# Patient Record
Sex: Male | Born: 2005
Health system: Southern US, Community
[De-identification: ages and names within clinical notes are randomized; demographics above are authoritative.]

## PROBLEM LIST (undated history)

## (undated) DIAGNOSIS — R790 Abnormal level of blood mineral: Secondary | ICD-10-CM

## (undated) DIAGNOSIS — M9252 Juvenile osteochondrosis of tibia and fibula, left leg: Secondary | ICD-10-CM

## (undated) DIAGNOSIS — J45909 Unspecified asthma, uncomplicated: Secondary | ICD-10-CM

## (undated) DIAGNOSIS — L309 Dermatitis, unspecified: Secondary | ICD-10-CM

## (undated) DIAGNOSIS — J302 Other seasonal allergic rhinitis: Secondary | ICD-10-CM

## (undated) DIAGNOSIS — M9251 Juvenile osteochondrosis of tibia and fibula, right leg: Secondary | ICD-10-CM

## (undated) DIAGNOSIS — M92523 Juvenile osteochondrosis of tibia tubercle, bilateral: Secondary | ICD-10-CM

## (undated) HISTORY — DX: Juvenile osteochondrosis of tibia and fibula, left leg: M92.52

## (undated) HISTORY — PX: NO PAST SURGERIES: SHX2092

## (undated) HISTORY — DX: Juvenile osteochondrosis of tibia tubercle, bilateral: M92.523

## (undated) HISTORY — DX: Abnormal level of blood mineral: R79.0

## (undated) HISTORY — DX: Dermatitis, unspecified: L30.9

## (undated) HISTORY — DX: Juvenile osteochondrosis of tibia and fibula, right leg: M92.51

---
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
## 2024-02-18 ENCOUNTER — Emergency Department (HOSPITAL_BASED_OUTPATIENT_CLINIC_OR_DEPARTMENT_OTHER)
Admission: EM | Admit: 2024-02-18 | Discharge: 2024-02-18 | Disposition: A | Discharge status: Home / Self Care | Payer: Self-pay | Attending: Emergency Medicine | Admitting: Emergency Medicine

## 2024-02-18 ENCOUNTER — Encounter (HOSPITAL_BASED_OUTPATIENT_CLINIC_OR_DEPARTMENT_OTHER): Payer: Self-pay | Admitting: Emergency Medicine

## 2024-02-18 ENCOUNTER — Other Ambulatory Visit: Payer: Self-pay

## 2024-02-18 DIAGNOSIS — J45901 Unspecified asthma with (acute) exacerbation: Secondary | ICD-10-CM

## 2024-02-18 DIAGNOSIS — J069 Acute upper respiratory infection, unspecified: Secondary | ICD-10-CM

## 2024-02-18 LAB — RESP PANEL BY RT-PCR (RSV, FLU A&B, COVID)  RVPGX2
Influenza A by PCR: NEGATIVE
Influenza B by PCR: NEGATIVE
Resp Syncytial Virus by PCR: NEGATIVE
SARS Coronavirus 2 by RT PCR: NEGATIVE

## 2024-02-18 LAB — GROUP A STREP BY PCR: Group A Strep by PCR: NOT DETECTED

## 2024-02-18 MED ORDER — ALBUTEROL SULFATE HFA 108 (90 BASE) MCG/ACT IN AERS: 1.0000 | INHALATION_SPRAY | Freq: Four times a day (QID) | RESPIRATORY_TRACT | 1 refills | Status: AC | PRN

## 2024-02-18 MED ORDER — ALBUTEROL SULFATE HFA 108 (90 BASE) MCG/ACT IN AERS
1.0000 | INHALATION_SPRAY | Freq: Once | RESPIRATORY_TRACT | Status: AC
  Administered 2024-02-18: 2 via RESPIRATORY_TRACT
  Filled 2024-02-18: qty 6.7

## 2024-02-18 MED ORDER — DEXAMETHASONE 10 MG/ML FOR PEDIATRIC ORAL USE
10.0000 mg | Freq: Once | INTRAMUSCULAR | Status: AC
  Administered 2024-02-18: 10 mg via ORAL

## 2024-02-18 MED ORDER — PREDNISONE 10 MG (21) PO TBPK: ORAL_TABLET | Freq: Every day | ORAL | 0 refills | Status: AC

## 2024-02-18 NOTE — ED Provider Notes (Signed)
 [place] [facility] Provider Note   CSN: [id number] Arrival date & time: 02/18/24  2038     Patient presents with: URI   Aaron Carter is a 18 y.o. male.  Past history significant for asthma presents today for cough and bodyaches since this morning.  Patient denies fever, chills, nausea, vomiting, chest pain, or shortness of breath.    URI Presenting symptoms: cough        Prior to Admission medications   Medication Sig Start Date End Date Taking? Authorizing Provider  albuterol  (VENTOLIN  HFA) 108 (90 Base) MCG/ACT inhaler Inhale 1-2 puffs into the lungs every 6 (six) hours as needed for wheezing or shortness of breath. 02/18/24  Yes Skarlett Sedlacek N, PA-C  predniSONE  (STERAPRED UNI-PAK 21 TAB) 10 MG (21) TBPK tablet Take by mouth daily. Take 6 tabs by mouth daily  for 2 days, then 5 tabs for 2 days, then 4 tabs for 2 days, then 3 tabs for 2 days, 2 tabs for 2 days, then 1 tab by mouth daily for 2 days 02/18/24  Yes Francis Ileana SAILOR, PA-C  albuterol  (PROAIR  HFA) 108 (90 Base) MCG/ACT inhaler Inhale 2 puffs into the lungs every 4 (four) hours as needed for wheezing or shortness of breath. 09/16/20   Willo Mini, NP  azelastine  (ASTELIN ) 0.1 % nasal spray 1 spray as needed.     [provider]  budesonide -formoterol  (SYMBICORT ) 160-4.5 MCG/ACT inhaler Take 2 puffs twice a day during upper respiratory infections for 1-2 weeks. Otherwise take 2 puffs once a day. 09/16/20   Willo Mini, NP  cetirizine  (ZYRTEC ) 10 MG tablet Take 1 tablet (10 mg total) by mouth daily. 09/16/20   Willo Mini, NP  EPINEPHrine  (AUVI-Q ) 0.3 mg/0.3 mL IJ SOAJ injection Use as direct for severe allergic reaction 09/16/20   Willo Mini, NP  fluticasone  (FLOVENT  HFA) 110 MCG/ACT inhaler Inhale 2 puffs into the lungs 2 (two) times daily. 09/16/20   Willo Mini, NP  meloxicam  (MOBIC ) 15 MG tablet One tab PO qAM with a meal for 2 weeks, then daily prn pain. 09/15/20   Curtis Debby PARAS,  MD  montelukast  (SINGULAIR ) 10 MG tablet Take 1 tablet (10 mg total) by mouth at bedtime. 09/16/20   Willo Mini, NP  Olopatadine  HCl 0.2 % SOLN olopatadine  0.2 % eye drops    [provider]  triamcinolone  (NASACORT ) 55 MCG/ACT AERO nasal inhaler 1-2 sprays per nostril once a day as needed for stuffy nose 09/16/20   Willo Mini, NP    Allergies: Patient has no known allergies.    Review of Systems  Respiratory:  Positive for cough.     Updated Vital Signs BP (!) 156/95 (BP Location: Right Arm)   Pulse 84   Temp 98.1 F (36.7 C) (Oral)   Resp 18   Ht 6' 5.25 (1.962 m)   SpO2 100%   Physical Exam Vitals and nursing note reviewed.  Constitutional:      General: He is not in acute distress.    Appearance: He is well-developed. He is not toxic-appearing.  HENT:     Head: Normocephalic and atraumatic.     Right Ear: External ear normal.     Left Ear: External ear normal.     Nose: Congestion and rhinorrhea present.     Mouth/Throat:     Mouth: Mucous membranes are moist.     Pharynx: Oropharynx is clear. Uvula midline. Posterior oropharyngeal erythema present. No oropharyngeal exudate or  uvula swelling.     Tonsils: No tonsillar exudate or tonsillar abscesses.  Eyes:     Extraocular Movements: Extraocular movements intact.     Conjunctiva/sclera: Conjunctivae normal.  Cardiovascular:     Rate and Rhythm: Normal rate and regular rhythm.     Pulses: Normal pulses.     Heart sounds: Normal heart sounds. No murmur heard. Pulmonary:     Effort: Pulmonary effort is normal. No respiratory distress.     Breath sounds: Normal breath sounds.     Comments: Few wheezes noted in all lobes Abdominal:     Palpations: Abdomen is soft.     Tenderness: There is no abdominal tenderness.  Musculoskeletal:        General: No swelling.     Cervical back: Neck supple.  Skin:    General: Skin is warm and dry.     Capillary Refill: Capillary refill takes less than 2 seconds.   Neurological:     General: No focal deficit present.     Mental Status: He is alert and oriented to person, place, and time.  Psychiatric:        Mood and Affect: Mood normal.     (all labs ordered are listed, but only abnormal results are displayed) Labs Reviewed  GROUP A STREP BY PCR  RESP PANEL BY RT-PCR (RSV, FLU A&B, COVID)  [id number]    EKG: None  Radiology: No results found.   Procedures   Medications Ordered in the ED  dexamethasone  (DECADRON ) 1 MG/ML solution 10 mg (has no administration in time range)  albuterol  (VENTOLIN  HFA) 108 (90 Base) MCG/ACT inhaler 1-2 puff (has no administration in time range)                                    Medical Decision Making  This patient presents to the ED for concern of cough and bodyaches differential diagnosis includes COVID, flu, RSV, strep pharyngitis, viral URI    Additional history obtained   Additional history obtained from Electronic Medical Record External records from outside source obtained and reviewed including family medicine notes   Lab Tests:  I Ordered, and personally interpreted labs.  The pertinent results include: Strep PCR negative, respiratory panel   Medicines ordered and prescription drug management:  I ordered medication including albuterol  and Decadron     I have reviewed the patients home medicines and have made adjustments as needed   Problem List / ED Course:  Considered for admission or further workup however patient's vital signs, physical exam, and labs are reassuring.  Patient's symptoms likely due to acute asthma exacerbation.  Patient given outpatient treatment with albuterol  inhaler and prednisone  taper.  Patient also given symptomatic management with over-the-counter medications.  Patient given return precautions.  I feel patient is safe for discharge at this time.     Final diagnoses:  Upper respiratory tract infection, unspecified type  Mild asthma with acute  exacerbation, unspecified whether persistent    ED Discharge Orders          Ordered    albuterol  (VENTOLIN  HFA) 108 (90 Base) MCG/ACT inhaler  Every 6 hours PRN        02/18/24 2213    predniSONE  (STERAPRED UNI-PAK 21 TAB) 10 MG (21) TBPK tablet  Daily        02/18/24 2213               Francis,  Ileana SAILOR, PA-C 02/18/24 2215    Tegeler, Lonni PARAS, MD 02/18/24 2330

## 2024-02-18 NOTE — Discharge Instructions (Signed)
 Today you were seen for an asthma exacerbation and upper respiratory infection.  Please pick up your albuterol  inhaler and prednisone  taper and use as directed.  You may alternate taking Tylenol and Motrin as needed for fever and pain, Flonase  as needed for nasal congestion, and plain Mucinex as needed for chest congestion. Thank you for letting us  treat you today. After reviewing your labs and imaging, I feel you are safe to go home. Please follow up with your PCP in the next several days and provide them with your records from this visit. Return to the Emergency Room if pain becomes severe or symptoms worsen.

## 2024-02-18 NOTE — ED Triage Notes (Signed)
 Pt c/o cough, body aches since this AM.

## 2024-02-23 ENCOUNTER — Encounter (HOSPITAL_BASED_OUTPATIENT_CLINIC_OR_DEPARTMENT_OTHER): Payer: Self-pay

## 2024-02-23 ENCOUNTER — Emergency Department (HOSPITAL_BASED_OUTPATIENT_CLINIC_OR_DEPARTMENT_OTHER)
Admission: EM | Admit: 2024-02-23 | Discharge: 2024-02-23 | Disposition: A | Discharge status: Home / Self Care | Payer: Self-pay | Attending: Emergency Medicine | Admitting: Emergency Medicine

## 2024-02-23 ENCOUNTER — Other Ambulatory Visit: Payer: Self-pay

## 2024-02-23 DIAGNOSIS — Z7951 Long term (current) use of inhaled steroids: Secondary | ICD-10-CM | POA: Insufficient documentation

## 2024-02-23 DIAGNOSIS — R052 Subacute cough: Secondary | ICD-10-CM

## 2024-02-23 DIAGNOSIS — R059 Cough, unspecified: Secondary | ICD-10-CM | POA: Insufficient documentation

## 2024-02-23 DIAGNOSIS — J45909 Unspecified asthma, uncomplicated: Secondary | ICD-10-CM | POA: Insufficient documentation

## 2024-02-23 LAB — RESP PANEL BY RT-PCR (RSV, FLU A&B, COVID)  RVPGX2
Influenza A by PCR: NEGATIVE
Influenza B by PCR: NEGATIVE
Resp Syncytial Virus by PCR: NEGATIVE
SARS Coronavirus 2 by RT PCR: NEGATIVE

## 2024-02-23 NOTE — Discharge Instructions (Addendum)
 Pick up these medications at [facility] [address] [place], [place] - [address]   albuterol   predniSONE   Contact a health care provider if: You have new symptoms, or your symptoms get worse. You cough up pus. You have a fever that does not go away or a cough that does not get better after 2-3 weeks. You cannot control your cough with medicine, and you are losing sleep. You have pain that gets worse or is not helped with medicine. You lose weight for no clear reason. You have night sweats. Get help right away if: You cough up blood. You have trouble breathing. Your heart is beating very fast. These symptoms may be an emergency. Get help right away. Call 911. Do not wait to see if the symptoms will go away. Do not drive yourself to the hospital.

## 2024-02-23 NOTE — ED Notes (Signed)
 ED Provider at bedside.

## 2024-02-23 NOTE — ED Triage Notes (Signed)
 Arrives POV with complaints of worsening cough and bodyaches x2 days

## 2024-02-23 NOTE — ED Provider Notes (Signed)
 [place] [facility] Provider Note   CSN: [id number] Arrival date & time: 02/23/24  9042     Patient presents with: Cough   Aaron Carter is a 18 y.o. male with persistent URI after recent diagnosis.  Patient continues to have cough.  States predominantly that he is still feeling really tired and would like to continue to have some rest.  He did not pick up his steroid taper at his last visit but understands that he may go do this to help with his asthma no active wheezing at this time.  Will give a work note.    Cough      Prior to Admission medications  Medication Sig Start Date End Date Taking? Authorizing Provider  albuterol  (PROAIR  HFA) 108 (90 Base) MCG/ACT inhaler Inhale 2 puffs into the lungs every 4 (four) hours as needed for wheezing or shortness of breath. 09/16/20   Willo Mini, NP  albuterol  (VENTOLIN  HFA) 108 (90 Base) MCG/ACT inhaler Inhale 1-2 puffs into the lungs every 6 (six) hours as needed for wheezing or shortness of breath. 02/18/24   Keith, Kayla N, PA-C  azelastine  (ASTELIN ) 0.1 % nasal spray 1 spray as needed.     [provider]  budesonide -formoterol  (SYMBICORT ) 160-4.5 MCG/ACT inhaler Take 2 puffs twice a day during upper respiratory infections for 1-2 weeks. Otherwise take 2 puffs once a day. 09/16/20   Willo Mini, NP  cetirizine  (ZYRTEC ) 10 MG tablet Take 1 tablet (10 mg total) by mouth daily. 09/16/20   Willo Mini, NP  EPINEPHrine  (AUVI-Q ) 0.3 mg/0.3 mL IJ SOAJ injection Use as direct for severe allergic reaction 09/16/20   Willo Mini, NP  fluticasone  (FLOVENT  HFA) 110 MCG/ACT inhaler Inhale 2 puffs into the lungs 2 (two) times daily. 09/16/20   Willo Mini, NP  meloxicam  (MOBIC ) 15 MG tablet One tab PO qAM with a meal for 2 weeks, then daily prn pain. 09/15/20   Curtis Debby PARAS, MD  montelukast  (SINGULAIR ) 10 MG tablet Take 1 tablet (10 mg total) by mouth at bedtime. 09/16/20   Willo Mini, NP  Olopatadine  HCl 0.2  % SOLN olopatadine  0.2 % eye drops    [provider]  predniSONE  (STERAPRED UNI-PAK 21 TAB) 10 MG (21) TBPK tablet Take by mouth daily. Take 6 tabs by mouth daily  for 2 days, then 5 tabs for 2 days, then 4 tabs for 2 days, then 3 tabs for 2 days, 2 tabs for 2 days, then 1 tab by mouth daily for 2 days 02/18/24   Keith, Kayla N, PA-C  triamcinolone  (NASACORT ) 55 MCG/ACT AERO nasal inhaler 1-2 sprays per nostril once a day as needed for stuffy nose 09/16/20   Willo Mini, NP    Allergies: Patient has no known allergies.    Review of Systems  Respiratory:  Positive for cough.     Updated Vital Signs BP 125/78   Pulse 64   Temp 98.1 F (36.7 C) (Oral)   Resp 20   Ht 6' 3 (1.905 m)   Wt 128 kg   SpO2 100%   BMI 35.27 kg/m   Physical Exam Vitals and nursing note reviewed.  Constitutional:      General: He is not in acute distress.    Appearance: He is well-developed. He is not diaphoretic.  HENT:     Head: Normocephalic and atraumatic.  Eyes:     General: No scleral icterus.    Conjunctiva/sclera: Conjunctivae normal.  Cardiovascular:  Rate and Rhythm: Normal rate and regular rhythm.     Heart sounds: Normal heart sounds.  Pulmonary:     Effort: Pulmonary effort is normal. No respiratory distress.     Breath sounds: Normal breath sounds. No wheezing.  Abdominal:     Palpations: Abdomen is soft.     Tenderness: There is no abdominal tenderness.  Musculoskeletal:     Cervical back: Normal range of motion and neck supple.  Skin:    General: Skin is warm and dry.  Neurological:     Mental Status: He is alert.  Psychiatric:        Behavior: Behavior normal.     (all labs ordered are listed, but only abnormal results are displayed) Labs Reviewed  RESP PANEL BY RT-PCR (RSV, FLU A&B, COVID)  RVPGX2    EKG: None  Radiology: No results found.   Procedures   Medications Ordered in the ED - No data to display                                  Medical  Decision Making  Pt CXR negative for acute infiltrate. Patients symptoms are consistent with URI, likely viral etiology. Discussed that antibiotics are not indicated for viral infections. Pt will be discharged with symptomatic treatment.  Verbalizes understanding and is agreeable with plan. Pt is hemodynamically stable & in NAD prior to dc.      Final diagnoses:  None    ED Discharge Orders     None          Arloa Chroman, PA-C 02/23/24 1310    Towana Ozell BROCKS, MD 02/23/24 [phone]
